# Patient Record
Sex: Female | Born: 1990 | Race: White | Hispanic: Yes | Marital: Single
Health system: Southern US, Community
[De-identification: ages and names within clinical notes are randomized; demographics above are authoritative.]

---

## 2017-05-29 ENCOUNTER — Emergency Department (HOSPITAL_COMMUNITY)
Admission: EM | Admit: 2017-05-29 | Discharge: 2017-05-30 | Disposition: A | Discharge status: Home / Self Care | Payer: No Typology Code available for payment source | Attending: Emergency Medicine | Admitting: Emergency Medicine

## 2017-05-29 ENCOUNTER — Emergency Department (HOSPITAL_COMMUNITY): Payer: No Typology Code available for payment source

## 2017-05-29 ENCOUNTER — Encounter (HOSPITAL_COMMUNITY): Payer: Self-pay | Admitting: Pharmacy Technician

## 2017-05-29 DIAGNOSIS — Y92410 Unspecified street and highway as the place of occurrence of the external cause: Secondary | ICD-10-CM | POA: Diagnosis not present

## 2017-05-29 DIAGNOSIS — S20219A Contusion of unspecified front wall of thorax, initial encounter: Secondary | ICD-10-CM | POA: Diagnosis not present

## 2017-05-29 DIAGNOSIS — Y999 Unspecified external cause status: Secondary | ICD-10-CM | POA: Diagnosis not present

## 2017-05-29 DIAGNOSIS — R51 Headache: Secondary | ICD-10-CM | POA: Insufficient documentation

## 2017-05-29 DIAGNOSIS — S161XXA Strain of muscle, fascia and tendon at neck level, initial encounter: Secondary | ICD-10-CM | POA: Diagnosis not present

## 2017-05-29 DIAGNOSIS — S20212A Contusion of left front wall of thorax, initial encounter: Secondary | ICD-10-CM

## 2017-05-29 DIAGNOSIS — Y9389 Activity, other specified: Secondary | ICD-10-CM | POA: Insufficient documentation

## 2017-05-29 DIAGNOSIS — S20211A Contusion of right front wall of thorax, initial encounter: Secondary | ICD-10-CM

## 2017-05-29 DIAGNOSIS — S20309A Unspecified superficial injuries of unspecified front wall of thorax, initial encounter: Secondary | ICD-10-CM | POA: Diagnosis present

## 2017-05-29 LAB — CBC WITH DIFFERENTIAL/PLATELET
BASOS ABS: 0 10*3/uL (ref 0.0–0.1)
BASOS PCT: 0 %
EOS ABS: 0 10*3/uL (ref 0.0–0.7)
Eosinophils Relative: 0 %
HEMATOCRIT: 40 % (ref 36.0–46.0)
HEMOGLOBIN: 13.6 g/dL (ref 12.0–15.0)
Lymphocytes Relative: 12 %
Lymphs Abs: 2.1 10*3/uL (ref 0.7–4.0)
MCH: 28.6 pg (ref 26.0–34.0)
MCHC: 34 g/dL (ref 30.0–36.0)
MCV: 84.2 fL (ref 78.0–100.0)
MONO ABS: 0.6 10*3/uL (ref 0.1–1.0)
Monocytes Relative: 4 %
NEUTROS ABS: 14.9 10*3/uL — AB (ref 1.7–7.7)
NEUTROS PCT: 84 %
Platelets: 238 10*3/uL (ref 150–400)
RBC: 4.75 MIL/uL (ref 3.87–5.11)
RDW: 13.6 % (ref 11.5–15.5)
WBC: 17.6 10*3/uL — ABNORMAL HIGH (ref 4.0–10.5)

## 2017-05-29 LAB — I-STAT CHEM 8, ED
BUN: 5 mg/dL — ABNORMAL LOW (ref 6–20)
CREATININE: 0.6 mg/dL (ref 0.44–1.00)
Calcium, Ion: 1.16 mmol/L (ref 1.15–1.40)
Chloride: 105 mmol/L (ref 101–111)
Glucose, Bld: 95 mg/dL (ref 65–99)
HEMATOCRIT: 42 % (ref 36.0–46.0)
HEMOGLOBIN: 14.3 g/dL (ref 12.0–15.0)
POTASSIUM: 3.3 mmol/L — AB (ref 3.5–5.1)
SODIUM: 142 mmol/L (ref 135–145)
TCO2: 23 mmol/L (ref 0–100)

## 2017-05-29 LAB — POC URINE PREG, ED: Preg Test, Ur: NEGATIVE

## 2017-05-29 MED ORDER — FENTANYL CITRATE (PF) 100 MCG/2ML IJ SOLN
50.0000 ug | Freq: Once | INTRAMUSCULAR | Status: AC
  Administered 2017-05-29: 50 ug via INTRAVENOUS
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
## 2017-05-29 NOTE — ED Notes (Signed)
Patient transported to X-ray 
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
## 2017-05-29 NOTE — ED Notes (Signed)
Pt returned from imaging.

## 2017-05-29 NOTE — ED Notes (Signed)
This RN witnessed Crystal, Rn waste of fentanyl

## 2017-05-30 MED ORDER — ONDANSETRON 4 MG PO TBDP
4.0000 mg | ORAL_TABLET | Freq: Once | ORAL | Status: AC
  Administered 2017-05-30: 4 mg via ORAL
  Filled 2017-05-30: qty 1

## 2017-05-30 MED ORDER — OXYCODONE-ACETAMINOPHEN 5-325 MG PO TABS: 1.0000 | ORAL_TABLET | Freq: Four times a day (QID) | ORAL | 0 refills | Status: AC | PRN

## 2017-05-30 MED ORDER — ONDANSETRON HCL 4 MG PO TABS: 4.0000 mg | ORAL_TABLET | Freq: Four times a day (QID) | ORAL | 0 refills | Status: AC

## 2017-05-30 MED ORDER — OXYCODONE-ACETAMINOPHEN 5-325 MG PO TABS
1.0000 | ORAL_TABLET | Freq: Once | ORAL | Status: AC
  Administered 2017-05-30: 1 via ORAL
  Filled 2017-05-30: qty 1

## 2017-05-30 NOTE — ED Notes (Signed)
50mcg Fentanyl wasted with Ladona Ridgelaylor RN

## 2017-05-30 NOTE — Discharge Instructions (Signed)
Your xray show no fractures You have multiple contusion which are painful and will get worse over the next 1-2 days than get better You have been given prescriptions for pain control as well as nausea control.  If not getting better in 3-4 days or you develop new or worsening symptoms return for further evaluation

## 2018-09-07 ENCOUNTER — Ambulatory Visit (INDEPENDENT_AMBULATORY_CARE_PROVIDER_SITE_OTHER): Payer: 59 | Admitting: Physician Assistant

## 2018-09-07 ENCOUNTER — Ambulatory Visit (INDEPENDENT_AMBULATORY_CARE_PROVIDER_SITE_OTHER): Payer: 59

## 2018-09-07 ENCOUNTER — Ambulatory Visit (INDEPENDENT_AMBULATORY_CARE_PROVIDER_SITE_OTHER): Payer: Self-pay | Admitting: Orthopaedic Surgery

## 2018-09-07 DIAGNOSIS — S61412A Laceration without foreign body of left hand, initial encounter: Secondary | ICD-10-CM

## 2018-09-07 MED ORDER — CEPHALEXIN 500 MG PO CAPS: 500.0000 mg | ORAL_CAPSULE | Freq: Four times a day (QID) | ORAL | 0 refills | Status: DC

## 2018-09-07 MED ORDER — CEPHALEXIN 500 MG PO CAPS: 500.0000 mg | ORAL_CAPSULE | Freq: Four times a day (QID) | ORAL | 0 refills | Status: AC

## 2018-09-07 NOTE — Progress Notes (Signed)
Office Visit Note   Patient: Kim Dawson           Date of Birth: [birth year]           MRN: [id number] Visit Date: 09/07/2018              Requested by: No referring provider defined for this encounter. PCP: Patient, No Pcp Per   Assessment & Plan: Visit Diagnoses:  1. Laceration of left hand without foreign body, initial encounter     Plan: Impression is left hand laceration.  I am not concerned for infection or tendon involvement at this point.  I have counseled the patient on the need to take antibiotics to prevent infection.  I have called in Keflex for 2 weeks.  She will continue to keep her wound clean with antibacterial soap and water daily.  She will follow-up with us on Tuesday, 10/15/2018.  Call with concerning questions in the meantime.  Follow-Up Instructions: Return in about 11 days (around 09/18/2018).   Orders:  Orders Placed This Encounter  Procedures  . XR Hand Complete Left   Meds ordered this encounter  Medications  . DISCONTD: cephALEXin (KEFLEX) 500 MG capsule    Sig: Take 1 capsule (500 mg total) by mouth 4 (four) times daily.    Dispense:  56 capsule    Refill:  0  . cephALEXin (KEFLEX) 500 MG capsule    Sig: Take 1 capsule (500 mg total) by mouth 4 (four) times daily.    Dispense:  56 capsule    Refill:  0      Procedures: No procedures performed   Clinical Data: No additional findings.   Subjective: Chief Complaint  Patient presents with  . 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  This occurred on 09/01/2018.  She was stripping wire with a curved blade when all of a sudden slipped and penetrated the hyperthenar eminence of the left hand.  She had significant bleeding but was afraid to go to the doctor.  She was seen in urgent care setting the following day.  She was given a tetanus shot  and a prescription for antibiotics which she never filled.  She comes in today for follow-up.  She is having some pain but mostly numbness to the left thumb and hyperthenar eminence.  No fevers or chills.  Review of Systems as detailed HPI.  All others reviewed and are negative.   Objective: Vital Signs: There were no vitals taken for this visit.  Physical Exam well-developed well-nourished female no acute distress.  Alert and oriented x3  Ortho Exam examination of the left hand reveals mild swelling to the hyperthenar eminence.  She does have approximately 1 cm laceration to the hyperthenar eminence which does appear to be healing.  There is no drainage.  No erythema or other signs of infection.  She has full motor function of her thumb in all other fingers.  She has decreased sensation in the median nerve distribution of the thumb.  Specialty Comments:  No specialty comments available.  Imaging: Xr Hand Complete Left  Result Date: 09/07/2018 No acute findings    PMFS History: Patient Active Problem List   Diagnosis Date Noted  . Laceration of left hand without foreign body 09/07/2018   No past medical history on file.  No family history  on file.  No past surgical history on file. Social History   Occupational History  . Not on file  Tobacco Use  . Smoking status: Not on file  Substance and Sexual Activity  . Alcohol use: Not on file  . Drug use: Not on file  . Sexual activity: Not on file

## 2018-09-18 ENCOUNTER — Ambulatory Visit (INDEPENDENT_AMBULATORY_CARE_PROVIDER_SITE_OTHER): Payer: 59 | Admitting: Orthopaedic Surgery

## 2018-09-18 ENCOUNTER — Encounter (INDEPENDENT_AMBULATORY_CARE_PROVIDER_SITE_OTHER): Payer: Self-pay | Admitting: Orthopaedic Surgery

## 2018-09-18 DIAGNOSIS — S61412A Laceration without foreign body of left hand, initial encounter: Secondary | ICD-10-CM

## 2018-09-18 NOTE — Progress Notes (Signed)
    Patient: Kim Dawson           Date of Birth: May 30, 1991           MRN: [id number] Visit Date: 09/18/2018 PCP: Patient, No Pcp Per   Assessment & Plan:  Chief Complaint:  Chief Complaint  Patient presents with  . Left Hand - Follow-up   Visit Diagnoses:  1. Laceration of left hand without foreign body, initial encounter     Plan: Kim Dawson is following up today for her laceration to her left hand.  She states that she has only been taking a decreased dose of her Keflex due to intolerable side effects.  On exam today her traumatic laceration is completely healed without any evidence of infection.  There is no swelling or redness or warmth.  The traumatic scar is slightly tender as expected.  She has full use of her left thumb and hand without any focal motor or sensory deficits.  She has some referred paresthesias into her thumb but I would expect this to resolve.  From my standpoint we can see her back as needed.  Questions encouraged and answered.  Follow-Up Instructions: Return if symptoms worsen or fail to improve.   Orders:  No orders of the defined types were placed in this encounter.  No orders of the defined types were placed in this encounter.   Imaging: No results found.  PMFS History: Patient Active Problem List   Diagnosis Date Noted  . Laceration of left hand without foreign body 09/07/2018   History reviewed. No pertinent past medical history.  History reviewed. No pertinent family history.  History reviewed. No pertinent surgical history. Social History   Occupational History  . Not on file  Tobacco Use  . Smoking status: Not on file  Substance and Sexual Activity  . Alcohol use: Not on file  . Drug use: Not on file  . Sexual activity: Not on file
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
